# Patient Record
Sex: Female | Born: 1975 | Race: Black or African American | Hispanic: No | Marital: Married | Smoking: Former smoker
Health system: Southern US, Community
[De-identification: ages and names within clinical notes are randomized; demographics above are authoritative.]

## PROBLEM LIST (undated history)

## (undated) DIAGNOSIS — K219 Gastro-esophageal reflux disease without esophagitis: Secondary | ICD-10-CM

## (undated) DIAGNOSIS — I10 Essential (primary) hypertension: Secondary | ICD-10-CM

---

## 2003-12-17 ENCOUNTER — Other Ambulatory Visit: Admission: RE | Admit: 2003-12-17 | Discharge: 2003-12-17 | Payer: Self-pay | Admitting: Obstetrics and Gynecology

## 2004-04-15 ENCOUNTER — Other Ambulatory Visit: Admission: RE | Admit: 2004-04-15 | Discharge: 2004-04-15 | Payer: Self-pay | Admitting: Obstetrics and Gynecology

## 2005-08-30 ENCOUNTER — Other Ambulatory Visit: Admission: RE | Admit: 2005-08-30 | Discharge: 2005-08-30 | Payer: Self-pay | Admitting: Obstetrics and Gynecology

## 2006-02-28 ENCOUNTER — Inpatient Hospital Stay (HOSPITAL_COMMUNITY): Admission: AD | Admit: 2006-02-28 | Discharge: 2006-02-28 | Payer: Self-pay | Admitting: Obstetrics and Gynecology

## 2006-03-06 ENCOUNTER — Inpatient Hospital Stay (HOSPITAL_COMMUNITY): Admission: AD | Admit: 2006-03-06 | Discharge: 2006-03-08 | Payer: Self-pay | Admitting: *Deleted

## 2006-03-06 ENCOUNTER — Encounter (INDEPENDENT_AMBULATORY_CARE_PROVIDER_SITE_OTHER): Payer: Self-pay | Admitting: *Deleted

## 2009-08-09 ENCOUNTER — Encounter: Admission: RE | Admit: 2009-08-09 | Discharge: 2009-08-09 | Payer: Self-pay | Admitting: Otolaryngology

## 2009-08-24 ENCOUNTER — Encounter: Payer: Self-pay | Admitting: Otolaryngology

## 2009-08-26 ENCOUNTER — Ambulatory Visit (HOSPITAL_COMMUNITY): Admission: RE | Admit: 2009-08-26 | Discharge: 2009-08-26 | Payer: Self-pay | Admitting: Otolaryngology

## 2009-09-04 ENCOUNTER — Ambulatory Visit: Payer: Self-pay | Admitting: Oncology

## 2009-09-16 LAB — CBC & DIFF AND RETIC
Basophils Absolute: 0.1 10*3/uL (ref 0.0–0.1)
Eosinophils Absolute: 0.3 10*3/uL (ref 0.0–0.5)
Immature Retic Fract: 3.3 % (ref 0.00–10.70)
LYMPH%: 19 % (ref 14.0–49.7)
MCH: 26.2 pg (ref 25.1–34.0)
MCHC: 32.7 g/dL (ref 31.5–36.0)
MCV: 80.1 fL (ref 79.5–101.0)
MONO#: 0.7 10*3/uL (ref 0.1–0.9)
NEUT#: 7.3 10*3/uL — ABNORMAL HIGH (ref 1.5–6.5)
NEUT%: 70.8 % (ref 38.4–76.8)
Platelets: 238 10*3/uL (ref 145–400)

## 2009-09-16 LAB — MORPHOLOGY
PLT EST: ADEQUATE
RBC Comments: NORMAL

## 2009-09-16 LAB — CHCC SMEAR

## 2009-09-21 LAB — HYPERCOAGULABLE PANEL, COMPREHENSIVE RET.
AntiThromb III Func: 92 % (ref 76–126)
DRVVT 1:1 Mix: 40.3 secs (ref 36.1–47.0)
DRVVT: 51.2 secs — ABNORMAL HIGH (ref 34.7–40.5)
Homocysteine: 3.1 umol/L — ABNORMAL LOW (ref 4.0–15.4)
PTT Lupus Anticoagulant: 46.2 secs — ABNORMAL HIGH (ref 32.0–43.4)
PTTLA 4:1 Mix: 43.1 secs (ref 36.3–48.8)
Protein C Activity: 141 % — ABNORMAL HIGH (ref 75–133)
Protein C, Total: 104 % (ref 70–140)
Protein S Ag, Total: 118 % (ref 70–140)

## 2009-10-25 IMAGING — XA IR ANGIO/CAROTID/CERV BI
10 of 13 series · 12 of 24 positions shown · IV contrast (IODINE)
Comparison: MRI and MRV scan of the brain of 08/09/2009.

CLINICAL DATA: Headaches.  Right-sided pulsatile tinnitus with
hearing loss.

BILATERAL CAROTID ARTERIOGRAPHY AND BILATERAL VERTEBRAL ARTERY
ANGIOGRAMS

[Series 2: carotid · 1 of 25 slices shown (1 of 9)]
[im 1/25]
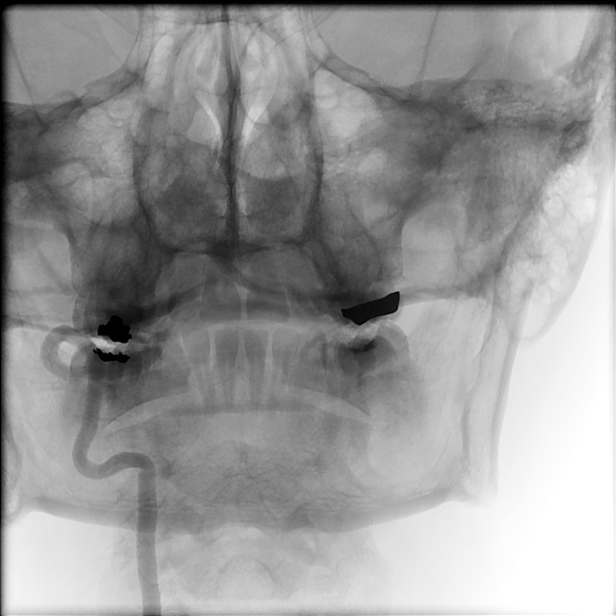

[Series 4: carotid · 1 of 8 slices shown (2 of 9)]
[im 1/8]
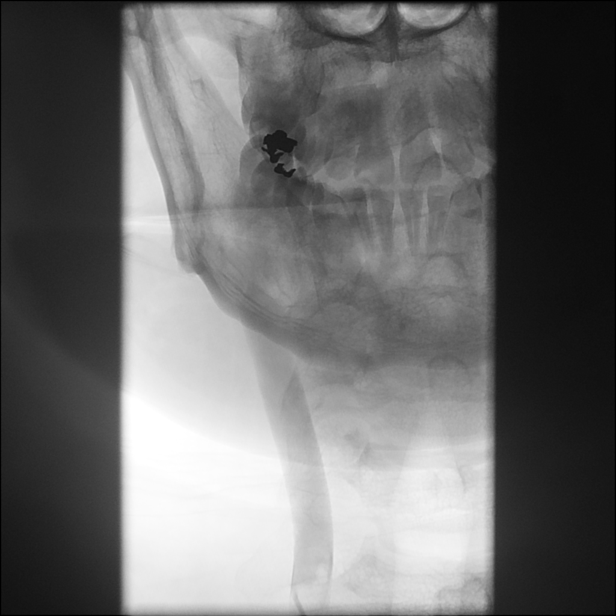

[Series 5: carotid · 2 of 38 slices shown (3 of 9)]
[im 13/38]
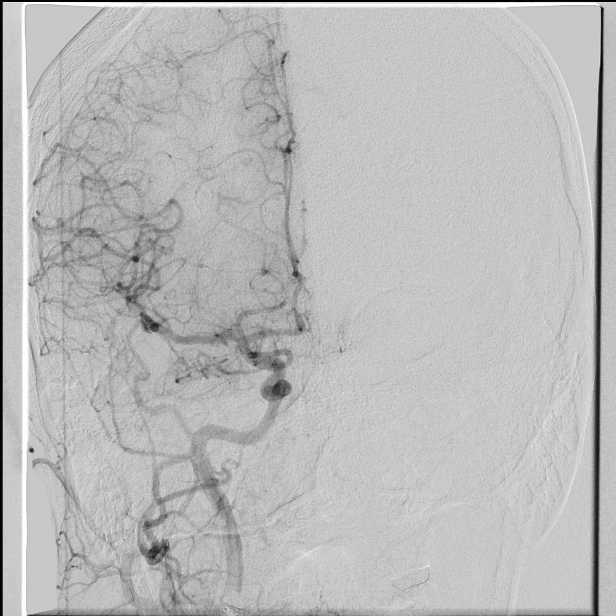
[im 38/38]
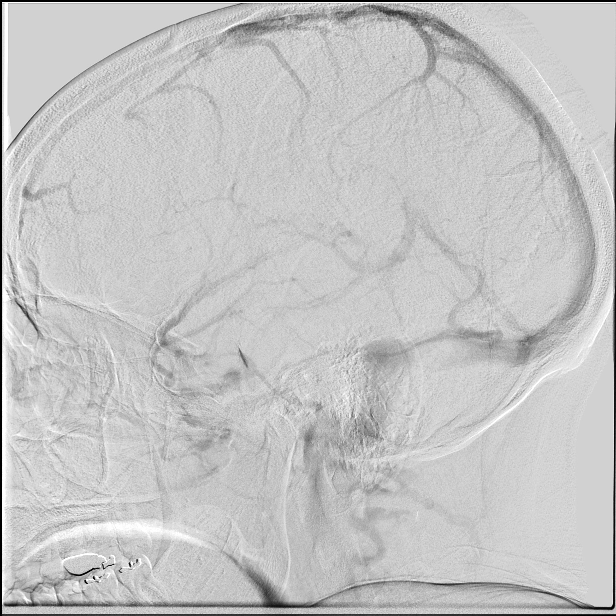

[Series 6: carotid · 1 of 26 slices shown (4 of 9)]
[im 26/26]
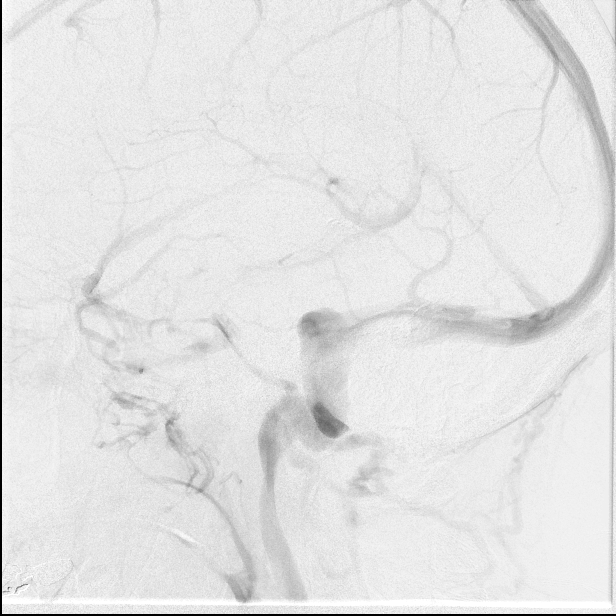

[Series 8: carotid · 2 of 30 slices shown (5 of 9)]
[im 1/30]
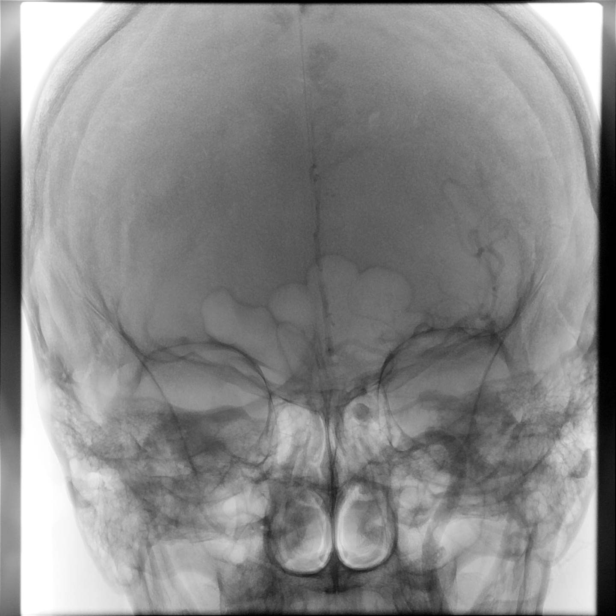
[im 30/30]
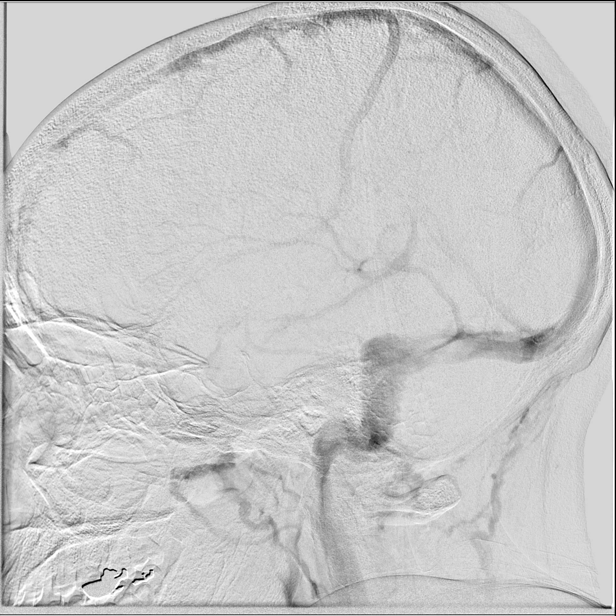

[Series 9: carotid · 1 of 29 slices shown (6 of 9)]
[im 15/29]
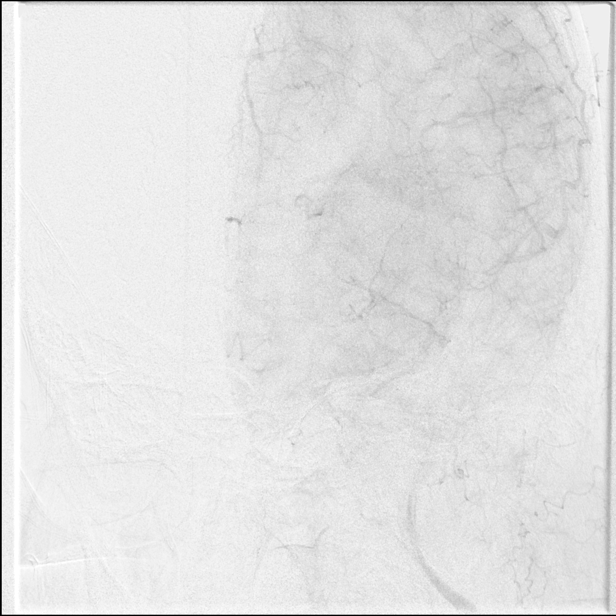

[Series 10: single · 1 of 2 slices shown]
[im 1/2]
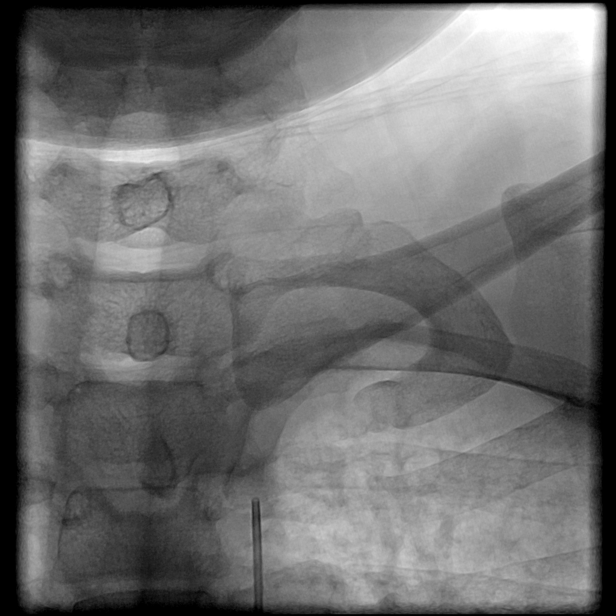

[Series 11: carotid · 1 of 20 slices shown (7 of 9)]
[im 20/20]
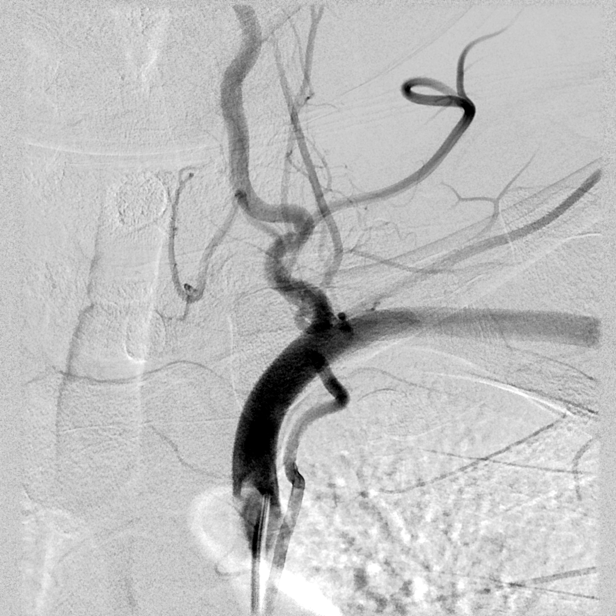

[Series 12: carotid · 1 of 22 slices shown (8 of 9)]
[im 22/22]
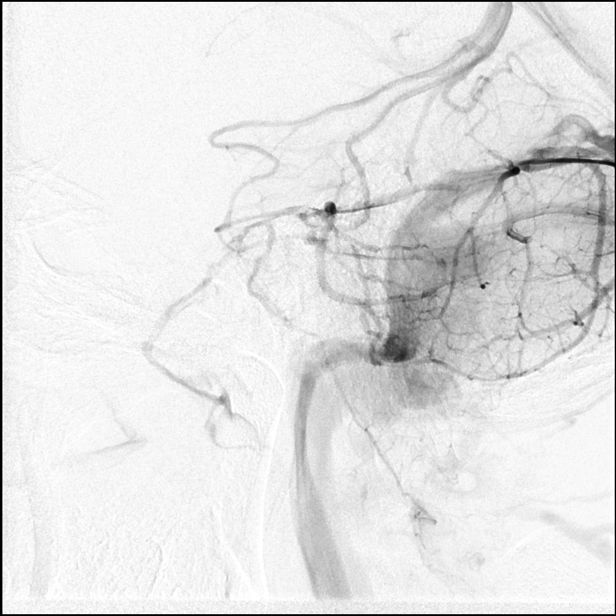

[Series 13: carotid · 1 of 24 slices shown (9 of 9)]
[im 24/24]
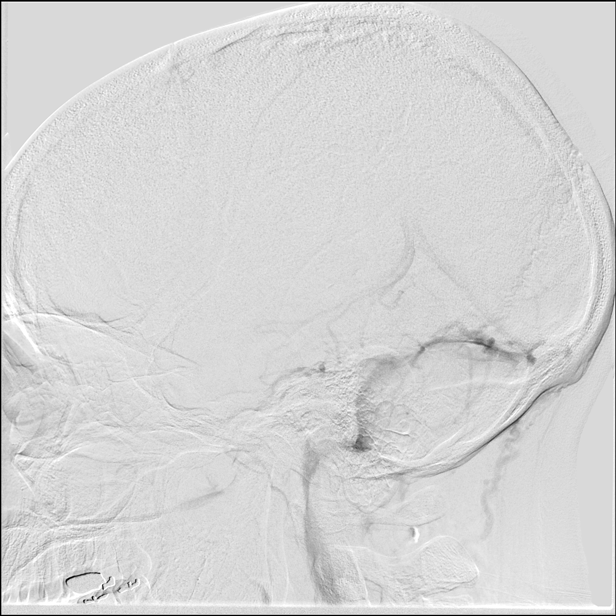

[12 of 24 positions shown; findings below may reference images not displayed]

Following a full explanation of the procedure along with the
potential associated complications, an informed witnessed consent
was obtained.

The right groin was prepped and draped in the usual sterile
fashion.  Thereafter using a modified Seldinger technique,
transfemoral access into the right common femoral artery was
obtained without difficulty.  Over a 0.035-inch guidewire, a 5-
French Pinnacle sheath was inserted.  Through this and also over a
0.035-inch guidewire, a 5-French JB-1 catheter was advanced to the
aortic arch region and selectively positioned in the right
vertebral artery, the right common carotid artery, the left common
carotid artery and the left vertebral artery.

There were no acute complications.  The patient tolerated the
procedure well.

Medications utilized: Versed 1 mg IV.  Fentanyl 25 mcg IV.

Contrast: Omnipaque 300 approximately 75 ml.
FINDINGS: The right vertebral artery origin is normal.  The vessel
is seen to be opacify normally to the cranial skull base.

Normal opacification of the right posterior inferior cerebellar
artery and the right vertebrobasilar junction is seen.

The basilar artery and the opacified portions of the posterior
cerebral arteries, the superior cerebellar arteries and the
anterior-inferior cerebellar arteries are normal into the capillary
and venous phases.  Unopacified blood is seen in the basilar artery
from the more dominant left vertebral artery.

The right common carotid arteriogram demonstrates the right
external carotid artery and its major branches to be normal.  The
right internal carotid artery at the bulb to the cranial skull base
is also normally opacified.  The petrous, the cavernous and the
supraclinoid segments are normal.  A dominant right posterior
communicating artery is seen to opacify the right posterior
cerebral and superior cerebellar artery distributions.

The right middle and the right anterior cerebral arteries are seen
to opacify normally into the capillary phase.

The venous phase demonstrates predominant opacification of the
right transverse and sigmoid sinuses.

The left common carotid arteriogram demonstrates the left external
carotid artery and its major branches to be normal.

The left internal carotid artery at the bulb to the cranial skull
base is also seen to opacify normally.

The petrous, the cavernous and the supraclinoid segments are
normal.  Again a dominant left posterior communicating artery is
seen opacifying the left posterior cerebral and transiently
superior cerebellar artery distributions.

The left middle and the left anterior cerebral arteries are seen to
opacify normally into the capillary phase.  The venous phase
demonstrates severely attenuated caliber of the left transverse
sinus to the level of the inflow from the vein of Rufus?.  The left
sigmoid sinus and the left internal jugular vein have a relatively
increased caliber.  Again predominant outflow of the venous blood
is via the right transverse sinus and right sigmoid sinus and into
the right internal jugular vein.

The left vertebral artery origin is normal.  The vessel is seen to
opacify normally to the cranial skull base.  There is normal
opacification of the left vertebrobasilar junction and the left
posterior inferior cerebellar artery.

The basilar artery, the opacified portions of the posterior
cerebral arteries, the superior cerebellar arteries and the
anterior-inferior cerebellar arteries is demonstrated to be normal
into the capillary phase with attenuated caliber of the left
transverse sinus, as described above.
IMPRESSION: 1.  Severely attenuated caliber of the left transverse sinus to the
level of the left vein of Rufus?.  This probably a normal variation
for the patient, with less likely early recanalization from a
recently occluded left transverse sinus, possibly related to a
thrombus.
2.  A followup MRV examination of the brain is suggested in 4 to 6
months if clinically indicated.

## 2011-04-01 LAB — DIFFERENTIAL
Basophils Absolute: 0 10*3/uL (ref 0.0–0.1)
Lymphocytes Relative: 6 % — ABNORMAL LOW (ref 12–46)
Monocytes Relative: 1 % — ABNORMAL LOW (ref 3–12)
Neutro Abs: 15.6 10*3/uL — ABNORMAL HIGH (ref 1.7–7.7)
Neutrophils Relative %: 93 % — ABNORMAL HIGH (ref 43–77)

## 2011-04-01 LAB — BASIC METABOLIC PANEL
BUN: 11 mg/dL (ref 6–23)
CO2: 20 mEq/L (ref 19–32)
Chloride: 109 mEq/L (ref 96–112)
Creatinine, Ser: 0.78 mg/dL (ref 0.4–1.2)
GFR calc non Af Amer: >60 mL/min (ref >60)
Potassium: 4.2 mEq/L (ref 3.5–5.1)
Sodium: 138 mEq/L (ref 135–145)

## 2011-04-01 LAB — PROTIME-INR: Prothrombin Time: 13.4 seconds (ref 11.6–15.2)

## 2011-04-01 LAB — CBC
Hemoglobin: 13.4 g/dL (ref 12.0–15.0)
MCHC: 33.1 g/dL (ref 30.0–36.0)
MCV: 82.4 fL (ref 78.0–100.0)
MCV: 83.5 fL (ref 78.0–100.0)
Platelets: 286 10*3/uL (ref 150–400)
RBC: 4.92 MIL/uL (ref 3.87–5.11)
RBC: 5.04 MIL/uL (ref 3.87–5.11)
RDW: 14.2 % (ref 11.5–15.5)
WBC: 16.8 10*3/uL — ABNORMAL HIGH (ref 4.0–10.5)

## 2011-04-01 LAB — HEPATIC FUNCTION PANEL
AST: 18 U/L (ref 0–37)
Total Protein: 7.9 g/dL (ref 6.0–8.3)

## 2011-05-10 NOTE — Consult Note (Signed)
[name], [name]              ACCOUNT NO.:  [id number]   MEDICAL RECORD NO.:  [id number]          PATIENT TYPE:  OUT   LOCATION:  XRAY                         FACILITY:  [facility]   PHYSICIAN:  [name], M.D.DATE OF BIRTH:  1976-10-16   DATE OF CONSULTATION:  08/24/2009  DATE OF DISCHARGE:  08/24/2009                                 CONSULTATION   CHIEF COMPLAINT:  Hearing loss in the right ear x6-7 months.   BRIEF HISTORY:  This is a very pleasant 35 year old female referred to  Dr. Corliss Contreras through the courtesy of Dr. Dorma Contreras after the patient was  evaluated for decreased hearing in her right ear and symptoms consistent  with pulsatile tinnitus.  The patient had an MRI/MRV performed on August 09, 2009.  This revealed a diminutive left transverse sinus smaller than  usually identified with a thin but continuous channel from the torcula  to the sigmoid sinus.  It was felt that this might possibly represent a  chronic occlusion with recannulization.  There was no evidence of clot  within the sinus.  The distal right transverse sinus bifurcated prior to  its junction with the sigmoid a common area for developmental  anomalies/arachnoid granulations.  Again, no findings were conclusive  for venous sinus thrombosis.  Based on this study, the patient has been  referred for a cerebral angiogram for further evaluation.  She presents  today to discuss the angiogram with Dr. Corliss Contreras.   PAST MEDICAL HISTORY:  Significant for:  1. Hypertension.  2. The patient has had daily headaches for several years.  She states      they are not severe.  3. She has had some childhood ear infections.  4. She does have a history of tobacco use.  5. She does use some sort of injectable birth control medication.  6. She has had some weight gain.  7. She denies any visual symptoms.  8. She notes that the sound in her right ear will disappear when      turning her head to the right and when  pressing on her right      internal jugular vein.   SURGICAL HISTORY:  Significant for:  1. A C-section in March 2007.  2. She has also had some wisdom teeth removed.  3. She has had a keloid surgery performed.   ALLERGIES:  The patient has no known drug allergies.  She does report  that SHRIMP causes her throat to itch.  She typically does not eat  seafood.   CURRENT MEDICATIONS:  1. Lasix 20 mg daily which was started by Dr. Dorma Contreras.  The patient      states her headaches have improved somewhat since starting the      Lasix.  2. She is also on Wellbutrin 150 mg daily.  She states she has to      tried to help her quit smoking, although she has not had any real      success so far.  3. She is on an injectable birth control method.   SOCIAL HISTORY:  The patient is married.  She has one 53-year-old  daughter.  The patient lives in [place] with her husband.  She smokes  5-8 cigarettes per day.  She does not use alcohol.  She works as a  Advertising copywriter at [employer].  She has a very sedentary-  type job.   FAMILY HISTORY:  Her mother is alive at age 7, she has a history of  hyperlipidemia.  Her father is alive at age 55, he has hypertension and  prostate cancer.   IMPRESSION AND PLAN:  The patient presents today to discuss a cerebral  angiogram, recommended by Dr. Dorma Contreras after the patient had an abnormal  MRI/MRV on August 09, 2009.  Please Contreras the description as noted above.  The cerebral angiogram was described in detail along with the risks and  benefits including bleeding, infection, nephropathy, allergy to the  contrast dye, and small risk of stroke.  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   Dr. Corliss Contreras shared the images from the MRI/MRV with the patient.  He  pointed out the areas of abnormality and discussed possible diagnoses,   although he told the patient that he could not be certain as to the  cause until we actually had the results from the angiogram.  He did  recommend that the patient try to quit smoking.  He also recommended  that she consider going off her current birth control method if it would  involve estrogen.  All of the patient's questions were answered.  Greater than 30 minutes was spent on this evaluation.  The patient has  been scheduled for the angiogram this Wednesday, August 26, 2009.      [name], P.A.    ______________________________  [name]. [name], M.D.    DR/MEDQ  D:  08/24/2009  T:  08/25/2009  Job:  [id number]   cc:   [name], M.D.  [facility]

## 2011-05-13 NOTE — Discharge Summary (Signed)
[name], [name]             ACCOUNT NO.:  [id number]   MEDICAL RECORD NO.:  [id number]          PATIENT TYPE:  INP   LOCATION:  9103                          [facility]   PHYSICIAN:  Charles A. Delcambre, MDDATE OF BIRTH:  October 09, 1976   DATE OF ADMISSION:  03/06/2006  DATE OF DISCHARGE:                                 DISCHARGE SUMMARY   PRIMARY DISCHARGE DIAGNOSES:  1.  Intrauterine pregnancy 38 weeks 6 days.  2.  Nonreassuring fetal heart rate.  3.  Arrest of dilation at 4-5 cm.  4.  Meconium.   PROCEDURE:  Primary low transverse cesarean section.   DISPOSITION:  Patient discharged home to follow up in the office in 48 hours  to discontinue staples.  She is given convalescent instructions.  Notify of  increased bleeding or pain, temperature over 100 degrees, or incisional  drainage, headaches, scotomata, right upper quadrant pain.  She is  instructed no driving for two weeks, no lifting greater than 25 pounds for  one month.  She is given prescription for Percocet one to two p.o. q.4h.  p.r.n. #40 and Micronor one p.o. daily one pack refilled x1 year.  She is to  use over-the-counter iron, her preference, one tablet a day.   LABORATORIES:  Postoperative hematocrit 28.4, hemoglobin 9.6.   OPERATIVE FINDING:  Vigorous female.  Apgars 8 and 9.  3005 g.   H&P is written in the chart.   Patient was admitted in labor.  Underwent spontaneous labor, then Pitocin  augmentation.  Had nonreassuring fetal heart rate develop at 4-5 cm dilation  and for this reason recommendation was for cesarean section.  Cesarean  section was carried out without complication.  Postoperatively patient had  routine postoperative course without febrile morbidity.  She did have some  mild elevated blood pressures 121/98, 134/90.  By the time we discharged  pressure had normalized and this had lowered to this degree and magnesium  prophylaxis was not carried out.  PIH laboratories were negative  and  proteinuria was negative.  She had good pain relief postoperatively with  epidural and Duramorph and was switched to p.o. pain medications  postoperative day one with good control of pain.  She voided without  difficulty with catheter removed postoperative day one.  She was given  general diet with flatus returned postoperative day #1.  She continued to do  very well postoperative day #2 and was discharged home with follow-up as  noted above per her adamant request to go home on postoperative day #2 with  instructions as noted above.      Charles A. Sydnee Cabal, MD  Electronically Signed    CAD/MEDQ  D:  03/08/2006  T:  03/08/2006  Job:  [id number]

## 2011-05-13 NOTE — Op Note (Signed)
[name], [name]             ACCOUNT NO.:  [id number]   MEDICAL RECORD NO.:  [id number]          PATIENT TYPE:  INP   LOCATION:  9103                          [facility]   PHYSICIAN:  Charles A. Delcambre, MDDATE OF BIRTH:  1976/08/15   DATE OF PROCEDURE:  03/06/2006  DATE OF DISCHARGE:                                 OPERATIVE REPORT   PREOPERATIVE DIAGNOSES:  1.  Intrauterine pregnancy at 38 weeks and 6 days.  2.  Non-reassuring fetal heart rate tracing.  3.  Arrest of dilation at 5 cm.  4.  Meconium.   POSTOPERATIVE DIAGNOSES:  1.  Intrauterine pregnancy at 38 weeks and 6 days.  2.  Non-reassuring fetal heart rate tracing.  3.  Arrest of dilation at 5 cm.  4.  Meconium.   OPERATION/PROCEDURE:  Primary low transverse cesarean section.   SURGEON:  Charles A. Sydnee Cabal, M.D.   ASSISTANT:  None.   ANESTHESIA:  Epidural.   SPECIMENS:  Cord arterial PH 7.28 as I recollect. Not on the chart at this  time.  Placenta to pathology.   OPERATIVE FINDINGS:  Female baby.  Vigorous.  Apgars 8 and 9.  Weight not on  the chart as of this point.   ESTIMATED BLOOD LOSS:  500 mL.   COUNTS:  Instrument, sponge and needle counts correct x2.   DESCRIPTION OF PROCEDURE:  The patient was taken to the operating room,  placed in the supine position and anesthesia was dosed and found to be  adequate.  Sterile prep and drape was undertaken.  A Pfannenstiel incision  was made with the knife and carried down to the fascia.  The fascia was  incised with the knife and Mayo scissors.  Rectus sheath was released  superiorly and inferiorly.  Rectus muscle was bluntly dissected in the  midline.  Peritoneum was entered with the finger.  Traction was used to  extend this incision.  Vesicouterine peritoneum was incised with the  Metzenbaum scissors.  Blunt dissection was used to develop the bladder flap.  Bladder blade was placed.  The lower uterine segment transverse incision was  made to  the amniotomy without damage to the infant.  Traction was used to  extend the incision.  Hand was inserted.  Occiput was lifted to the incision  site.  Fundal pressure was placed by the operator assistant.  Infant was  delivered to the head and shoulders without difficulty.  DeLee suction was  carried out on the patient's abdomen before the baby cried.  Meconium  appeared to be light.  At this time after delivery of the remainder of the  baby, baby vigorously cried, was bulb-suctioned further and shown to the  patient and handed off to the neonatologist, Dr. Eric Form in attendance.  Cord  gas was sent. As noted cord blood was taken.  Placenta was manually  expressed and sent to pathology.  Uterus was externalized for repair.  Internal surfaces were wiped with a moistened lap.  A #1 chromic running  locking first layer and then running imbricating non-locking second layer  was placed for a two-layer closure  with good hemostasis resulting.  Uterus  was reinternalized.  The bladder flap was with good hemostasis.  Incision  had good hemostasis as well.  Pericolic gutters were cleansed of clotted  blood material and irrigated.  Bladder flap was irrigated.  All incision  sites, uterine incision site and bladder flap were noted to be of excellent  hemostasis.  Subfascial hemostasis was excellent.  Fascia was then closed  with #1 Vicryl running non-locking suture.  Subcutaneous hemostasis was  excellent and after minor electrocautery, irrigation was carried out to this  layer of sterile skin.  Clips were used to close the skin.  The patient did  note that she had history of keloiding and we did discuss the possibility of  keloid formation with the abdominal scar but it did nothing specific at this  point.  She was in agreement and did consent to this.  Incision was made as  small as possible for the surgery.  The patient was taken to recovery with  the physician in attendance having tolerated the  procedure well.      Charles A. Sydnee Cabal, MD  Electronically Signed     CAD/MEDQ  D:  03/06/2006  T:  03/07/2006  Job:  [id number]

## 2014-09-19 ENCOUNTER — Other Ambulatory Visit: Payer: Self-pay | Admitting: Obstetrics and Gynecology

## 2014-09-22 LAB — CYTOLOGY - PAP

## 2015-04-01 LAB — OB RESULTS CONSOLE HIV ANTIBODY (ROUTINE TESTING): HIV: NONREACTIVE

## 2015-04-01 LAB — OB RESULTS CONSOLE RUBELLA ANTIBODY, IGM: RUBELLA: IMMUNE

## 2015-04-01 LAB — OB RESULTS CONSOLE ABO/RH: RH TYPE: POSITIVE

## 2015-10-05 NOTE — Anesthesia Preprocedure Evaluation (Addendum)
Anesthesia Evaluation  Patient identified by MRN, date of birth, ID band Patient awake    Reviewed: Allergy & Precautions, NPO status , Patient's Chart, lab work & pertinent test results, reviewed documented beta blocker date and time   Airway Mallampati: II   Neck ROM: Full    Dental  (+) Teeth Intact, Dental Advisory Given   Pulmonary Current Smoker (1/2 ppd), former smoker,    breath sounds clear to auscultation       Cardiovascular hypertension, Pt. on medications  Rhythm:Regular     Neuro/Psych  Headaches,    GI/Hepatic   Endo/Other    Renal/GU      Musculoskeletal   Abdominal (+) + obese,   Peds  Hematology   Anesthesia Other Findings   Reproductive/Obstetrics For RCS, BTL                            Anesthesia Physical Anesthesia Plan  ASA: II  Anesthesia Plan: Spinal   Post-op Pain Management:    Induction:   Airway Management Planned: Natural Airway  Additional Equipment:   Intra-op Plan:   Post-operative Plan:   Informed Consent: I have reviewed the patients History and Physical, chart, labs and discussed the procedure including the risks, benefits and alternatives for the proposed anesthesia with the patient or authorized representative who has indicated his/her understanding and acceptance.     Plan Discussed with:   Anesthesia Plan Comments:         Anesthesia Quick Evaluation

## 2015-10-08 ENCOUNTER — Encounter (HOSPITAL_COMMUNITY)
Admission: RE | Admit: 2015-10-08 | Discharge: 2015-10-08 | Disposition: A | Discharge status: Home / Self Care | Payer: PRIVATE HEALTH INSURANCE | Source: Ambulatory Visit | Attending: Obstetrics and Gynecology | Admitting: Obstetrics and Gynecology

## 2015-10-08 ENCOUNTER — Encounter (HOSPITAL_COMMUNITY): Payer: Self-pay

## 2015-10-08 HISTORY — DX: Essential (primary) hypertension: I10

## 2015-10-08 HISTORY — DX: Gastro-esophageal reflux disease without esophagitis: K21.9

## 2015-10-08 LAB — CBC
HCT: 30.2 % — ABNORMAL LOW (ref 36.0–46.0)
HEMOGLOBIN: 9.8 g/dL — AB (ref 12.0–15.0)
MCH: 26.6 pg (ref 26.0–34.0)
MCHC: 32.5 g/dL (ref 30.0–36.0)
MCV: 82.1 fL (ref 78.0–100.0)
Platelets: 191 10*3/uL (ref 150–400)
RBC: 3.68 MIL/uL — ABNORMAL LOW (ref 3.87–5.11)
RDW: 15 % (ref 11.5–15.5)
WBC: 13.9 10*3/uL — AB (ref 4.0–10.5)

## 2015-10-08 LAB — TYPE AND SCREEN
ABO/RH(D): A POS
Antibody Screen: NEGATIVE

## 2015-10-08 LAB — COMPREHENSIVE METABOLIC PANEL
ALBUMIN: 2.8 g/dL — AB (ref 3.5–5.0)
ALT: 28 U/L (ref 14–54)
ANION GAP: 9 (ref 5–15)
AST: 24 U/L (ref 15–41)
Alkaline Phosphatase: 120 U/L (ref 38–126)
BUN: 6 mg/dL (ref 6–20)
CO2: 21 mmol/L — AB (ref 22–32)
Calcium: 8.7 mg/dL — ABNORMAL LOW (ref 8.9–10.3)
Chloride: 109 mmol/L (ref 101–111)
Creatinine, Ser: 0.53 mg/dL (ref 0.44–1.00)
GFR calc Af Amer: >60 mL/min (ref >60)
GFR calc non Af Amer: >60 mL/min (ref >60)
GLUCOSE: 103 mg/dL — AB (ref 65–99)
POTASSIUM: 3.8 mmol/L (ref 3.5–5.1)
SODIUM: 139 mmol/L (ref 135–145)
Total Bilirubin: 0.5 mg/dL (ref 0.3–1.2)
Total Protein: 6.2 g/dL — ABNORMAL LOW (ref 6.5–8.1)

## 2015-10-08 LAB — RPR: RPR: NONREACTIVE

## 2015-10-08 LAB — ABO/RH: ABO/RH(D): A POS

## 2015-10-08 NOTE — H&P (Signed)
[name] is a 39 y.o. female G3P1011 at 37wk with CHTN, PIH, BP not well-controlled with labetalol 200 bid, earlier in preg controlled with labetalol 100 bid.  24hr urine negative for PreEclampsia, normal labs.  Pregnancy complicated by HTN.  Pregnancy also complicated by h/o LTCS and AMA.  Cervix not favorable. Pt desres BTL - Partial salpingectomy, d/w pt r/b/a  Maternal Medical History:  Contractions: Frequency: irregular.    Fetal activity: Perceived fetal activity is normal.    Prenatal complications: PIH.   Prenatal Complications - Diabetes: none.    OB History    Gravida Para Term Preterm AB TAB SAB Ectopic Multiple Living   3 1 1  0 1 0 0 0 0 1    G1 02/2006 LTCS 6#10 female G2 SAB G3 present  No abnl pap No STD  Past Medical History  Diagnosis Date  . Hypertension   . GERD (gastroesophageal reflux disease)     pregnacy related   Past Surgical History  Procedure Laterality Date  . Cesarean section      x1   Family History: HTN, DM, CAD Social History:  reports that she quit smoking about 21 months ago. She does not have any smokeless tobacco history on file. She reports that she does not drink alcohol or use illicit drugs. married, works in [employer] Meds Labetalol 200 bid, PNV All NKDA   Prenatal Transfer Tool  Maternal Diabetes: No Genetic Screening: Declined Maternal Ultrasounds/Referrals: Normal Fetal Ultrasounds or other Referrals:  None Maternal Substance Abuse:  No Significant Maternal Medications:  Meds include: Other: Labetalol 200mg  bid Significant Maternal Lab Results:  Lab values include: Group B Strep positive Other Comments:  failed glucola, passed 3hr GTT; PIH neg 24 hr urine  Review of Systems  Constitutional: Negative.   Eyes: Negative.   Respiratory: Negative.   Cardiovascular: Negative.   Gastrointestinal: Positive for abdominal pain.  Genitourinary: Negative.   Musculoskeletal: Positive for back pain.  Skin: Negative.    Neurological: Positive for headaches.  Psychiatric/Behavioral: Negative.       Last menstrual period 12/26/2013. Maternal Exam:  Uterine Assessment: Contraction frequency is irregular.   Abdomen: Patient reports no abdominal tenderness. Surgical scars: low transverse.   Fundal height is appropriate for gestation.   Estimated fetal weight is 6.5-7.5#.   Fetal presentation: vertex  Introitus: Normal vulva. Normal vagina.    Physical Exam  Constitutional: She is oriented to person, place, and time. She appears well-developed and well-nourished.  HENT:  Head: Normocephalic and atraumatic.  Cardiovascular: Normal rate and regular rhythm.   Respiratory: Effort normal and breath sounds normal. No respiratory distress. She has no wheezes.  GI: Soft. Bowel sounds are normal. She exhibits no distension. There is no tenderness.  Musculoskeletal: Normal range of motion.  Neurological: She is alert and oriented to person, place, and time.  Skin: Skin is warm and dry.  Psychiatric: She has a normal mood and affect. Her behavior is normal.    Prenatal labs: ABO, Rh: --/--/A POS, A POS (10/13 0825) Antibody: NEG (10/13 0825) Rubella:  immune RPR: Non Reactive (10/13 0825)  HBsAg:   neg HIV:   neg GBS:   positive  Hgb 12.5/Plt 279/Ur Cx neg/GC neg/Chl neg/ glucola 149 - 3hr normal  24hr urine 179mg  protein, nl LFTs, nl Cr Dated by US cwd LMP Nl anat, fundal plac, femlae - EDC 10/30/15  Assessment/Plan: 39yo G3P1011 at 37wk for rLTCS/BTL D/w pt r/b/a - will proceed Pt with PIH, worsening  BP Ancef for prophylaxis  Bovard-Stuckert, Chaim Gatley 10/08/2015, 9:56 PM

## 2015-10-08 NOTE — Patient Instructions (Addendum)
   Your procedure is scheduled on: OCT 14 AT 730AM  [facility] at: 6AM  Pick up the phone at the desk and dial [phone] and inform us of your arrival.  Please call this number if you have any problems the morning of surgery: [phone]  DO NOT EAT OR DRINK AFTER MIDNIGHT TONIGHT OCT 13  Take these medicines the morning of surgery with a SIP OF WATER: TAKE LABATOLOL DAY OF SURGERY  Do not wear jewelry, make-up, or FINGER nail polish No metal in your hair or on your body. Do not wear lotions, powders, perfumes.  You may wear deodorant.  Do not bring valuables to the hospital. Contacts, dentures or bridgework may not be worn into surgery.  Leave suitcase in the car. After Surgery it may be brought to your room. For patients being admitted to the hospital, checkout time is 11:00am the day of discharge.

## 2015-10-09 ENCOUNTER — Inpatient Hospital Stay (HOSPITAL_COMMUNITY): Payer: PRIVATE HEALTH INSURANCE | Admitting: Anesthesiology

## 2015-10-09 ENCOUNTER — Inpatient Hospital Stay (HOSPITAL_COMMUNITY)
Admission: RE | Admit: 2015-10-09 | Discharge: 2015-10-11 | DRG: 765 | Disposition: A | Discharge status: Home / Self Care | Payer: PRIVATE HEALTH INSURANCE | Source: Ambulatory Visit | Attending: Obstetrics and Gynecology | Admitting: Obstetrics and Gynecology

## 2015-10-09 ENCOUNTER — Encounter (HOSPITAL_COMMUNITY)
Admission: RE | Disposition: A | Discharge status: Home / Self Care | Payer: Self-pay | Source: Ambulatory Visit | Attending: Obstetrics and Gynecology

## 2015-10-09 ENCOUNTER — Encounter (HOSPITAL_COMMUNITY): Payer: Self-pay | Admitting: Anesthesiology

## 2015-10-09 DIAGNOSIS — O34211 Maternal care for low transverse scar from previous cesarean delivery: Secondary | ICD-10-CM | POA: Diagnosis present

## 2015-10-09 DIAGNOSIS — Z8249 Family history of ischemic heart disease and other diseases of the circulatory system: Secondary | ICD-10-CM

## 2015-10-09 DIAGNOSIS — O9962 Diseases of the digestive system complicating childbirth: Secondary | ICD-10-CM | POA: Diagnosis present

## 2015-10-09 DIAGNOSIS — Z3A37 37 weeks gestation of pregnancy: Secondary | ICD-10-CM | POA: Diagnosis not present

## 2015-10-09 DIAGNOSIS — Z87891 Personal history of nicotine dependence: Secondary | ICD-10-CM

## 2015-10-09 DIAGNOSIS — O99824 Streptococcus B carrier state complicating childbirth: Secondary | ICD-10-CM | POA: Diagnosis present

## 2015-10-09 DIAGNOSIS — O1092 Unspecified pre-existing hypertension complicating childbirth: Secondary | ICD-10-CM | POA: Diagnosis present

## 2015-10-09 DIAGNOSIS — O10913 Unspecified pre-existing hypertension complicating pregnancy, third trimester: Secondary | ICD-10-CM | POA: Diagnosis present

## 2015-10-09 DIAGNOSIS — Z302 Encounter for sterilization: Secondary | ICD-10-CM | POA: Diagnosis not present

## 2015-10-09 DIAGNOSIS — Z833 Family history of diabetes mellitus: Secondary | ICD-10-CM | POA: Diagnosis not present

## 2015-10-09 DIAGNOSIS — Z98891 History of uterine scar from previous surgery: Secondary | ICD-10-CM

## 2015-10-09 DIAGNOSIS — K219 Gastro-esophageal reflux disease without esophagitis: Secondary | ICD-10-CM | POA: Diagnosis present

## 2015-10-09 SURGERY — Surgical Case
Anesthesia: Spinal | Laterality: Bilateral

## 2015-10-09 MED ORDER — SIMETHICONE 80 MG PO CHEW
80.0000 mg | CHEWABLE_TABLET | ORAL | Status: DC | PRN
  Administered 2015-10-11: 80 mg via ORAL

## 2015-10-09 MED ORDER — SCOPOLAMINE 1 MG/3DAYS TD PT72
MEDICATED_PATCH | TRANSDERMAL | Status: AC
  Administered 2015-10-09: 1.5 mg via TRANSDERMAL
  Filled 2015-10-09: qty 1

## 2015-10-09 MED ORDER — LABETALOL HCL 200 MG PO TABS
200.0000 mg | ORAL_TABLET | Freq: Two times a day (BID) | ORAL | Status: DC
Start: 2015-10-09 — End: 2015-10-11
  Administered 2015-10-09 – 2015-10-11 (×4): 200 mg via ORAL
  Filled 2015-10-09 (×4): qty 1

## 2015-10-09 MED ORDER — SENNOSIDES-DOCUSATE SODIUM 8.6-50 MG PO TABS
2.0000 | ORAL_TABLET | ORAL | Status: DC
  Administered 2015-10-10 (×2): 2 via ORAL
  Filled 2015-10-09 (×2): qty 2

## 2015-10-09 MED ORDER — MORPHINE SULFATE (PF) 0.5 MG/ML IJ SOLN
INTRAMUSCULAR | Status: DC | PRN
  Administered 2015-10-09: .2 mg via EPIDURAL

## 2015-10-09 MED ORDER — LACTATED RINGERS IV SOLN
Freq: Once | INTRAVENOUS | Status: AC
Start: 2015-10-09 — End: 2015-10-09
  Administered 2015-10-09: 07:00:00 via INTRAVENOUS

## 2015-10-09 MED ORDER — OXYCODONE-ACETAMINOPHEN 5-325 MG PO TABS: 2.0000 | ORAL_TABLET | ORAL | Status: DC | PRN

## 2015-10-09 MED ORDER — LACTATED RINGERS IV SOLN
INTRAVENOUS | Status: DC | PRN
  Administered 2015-10-09: 08:00:00 via INTRAVENOUS

## 2015-10-09 MED ORDER — ONDANSETRON HCL 4 MG/2ML IJ SOLN
INTRAMUSCULAR | Status: DC | PRN
  Administered 2015-10-09: 4 mg via INTRAVENOUS

## 2015-10-09 MED ORDER — CEFAZOLIN SODIUM-DEXTROSE 2-3 GM-% IV SOLR
2.0000 g | INTRAVENOUS | Status: AC
  Administered 2015-10-09: 2 g via INTRAVENOUS

## 2015-10-09 MED ORDER — ONDANSETRON HCL 4 MG/2ML IJ SOLN
INTRAMUSCULAR | Status: AC
  Filled 2015-10-09: qty 2

## 2015-10-09 MED ORDER — CEFAZOLIN SODIUM-DEXTROSE 2-3 GM-% IV SOLR
INTRAVENOUS | Status: AC
  Filled 2015-10-09: qty 50

## 2015-10-09 MED ORDER — ZOLPIDEM TARTRATE 5 MG PO TABS: 5.0000 mg | ORAL_TABLET | Freq: Every evening | ORAL | Status: DC | PRN

## 2015-10-09 MED ORDER — KETOROLAC TROMETHAMINE 30 MG/ML IJ SOLN
30.0000 mg | Freq: Once | INTRAMUSCULAR | Status: AC
  Administered 2015-10-09: 30 mg via INTRAMUSCULAR

## 2015-10-09 MED ORDER — KETOROLAC TROMETHAMINE 30 MG/ML IJ SOLN
INTRAMUSCULAR | Status: AC
  Filled 2015-10-09: qty 1

## 2015-10-09 MED ORDER — TETANUS-DIPHTH-ACELL PERTUSSIS 5-2.5-18.5 LF-MCG/0.5 IM SUSP
0.5000 mL | Freq: Once | INTRAMUSCULAR | Status: AC
  Administered 2015-10-10: 0.5 mL via INTRAMUSCULAR
  Filled 2015-10-09: qty 0.5

## 2015-10-09 MED ORDER — OXYCODONE-ACETAMINOPHEN 5-325 MG PO TABS
1.0000 | ORAL_TABLET | ORAL | Status: DC | PRN
  Administered 2015-10-10: 1 via ORAL
  Filled 2015-10-09: qty 1

## 2015-10-09 MED ORDER — WITCH HAZEL-GLYCERIN EX PADS: 1.0000 "application " | MEDICATED_PAD | CUTANEOUS | Status: DC | PRN

## 2015-10-09 MED ORDER — LACTATED RINGERS IV SOLN
INTRAVENOUS | Status: DC
  Administered 2015-10-10: 01:00:00 via INTRAVENOUS

## 2015-10-09 MED ORDER — DIPHENHYDRAMINE HCL 25 MG PO CAPS
25.0000 mg | ORAL_CAPSULE | Freq: Four times a day (QID) | ORAL | Status: DC | PRN
Start: 2015-10-09 — End: 2015-10-11

## 2015-10-09 MED ORDER — SCOPOLAMINE 1 MG/3DAYS TD PT72
1.0000 | MEDICATED_PATCH | Freq: Once | TRANSDERMAL | Status: DC
  Administered 2015-10-09: 1.5 mg via TRANSDERMAL

## 2015-10-09 MED ORDER — OXYTOCIN 10 UNIT/ML IJ SOLN
INTRAMUSCULAR | Status: AC
  Filled 2015-10-09: qty 4

## 2015-10-09 MED ORDER — FENTANYL CITRATE (PF) 100 MCG/2ML IJ SOLN: 25.0000 ug | INTRAMUSCULAR | Status: DC | PRN

## 2015-10-09 MED ORDER — SIMETHICONE 80 MG PO CHEW
80.0000 mg | CHEWABLE_TABLET | Freq: Three times a day (TID) | ORAL | Status: DC
  Administered 2015-10-09 – 2015-10-10 (×4): 80 mg via ORAL
  Filled 2015-10-09 (×5): qty 1

## 2015-10-09 MED ORDER — MORPHINE SULFATE (PF) 0.5 MG/ML IJ SOLN
INTRAMUSCULAR | Status: AC
  Filled 2015-10-09: qty 100

## 2015-10-09 MED ORDER — IBUPROFEN 800 MG PO TABS
800.0000 mg | ORAL_TABLET | Freq: Three times a day (TID) | ORAL | Status: DC
  Administered 2015-10-09 – 2015-10-11 (×5): 800 mg via ORAL
  Filled 2015-10-09 (×5): qty 1

## 2015-10-09 MED ORDER — SIMETHICONE 80 MG PO CHEW
80.0000 mg | CHEWABLE_TABLET | ORAL | Status: DC
  Administered 2015-10-10 (×2): 80 mg via ORAL
  Filled 2015-10-09 (×2): qty 1

## 2015-10-09 MED ORDER — PHENYLEPHRINE 8 MG IN D5W 100 ML (0.08MG/ML) PREMIX OPTIME
INJECTION | INTRAVENOUS | Status: AC
  Filled 2015-10-09: qty 100

## 2015-10-09 MED ORDER — BUPIVACAINE IN DEXTROSE 0.75-8.25 % IT SOLN
INTRATHECAL | Status: DC | PRN
  Administered 2015-10-09: 1.6 mL via INTRATHECAL

## 2015-10-09 MED ORDER — OXYTOCIN 10 UNIT/ML IJ SOLN
40.0000 [IU] | INTRAMUSCULAR | Status: DC | PRN
Start: 2015-10-09 — End: 2015-10-09
  Administered 2015-10-09: 40 [IU] via INTRAVENOUS

## 2015-10-09 MED ORDER — PHENYLEPHRINE 8 MG IN D5W 100 ML (0.08MG/ML) PREMIX OPTIME
INJECTION | INTRAVENOUS | Status: DC | PRN
  Administered 2015-10-09: 30 ug/min via INTRAVENOUS

## 2015-10-09 MED ORDER — PROMETHAZINE HCL 25 MG/ML IJ SOLN
6.2500 mg | INTRAMUSCULAR | Status: DC | PRN
Start: 2015-10-09 — End: 2015-10-09

## 2015-10-09 MED ORDER — DIBUCAINE 1 % RE OINT: 1.0000 "application " | TOPICAL_OINTMENT | RECTAL | Status: DC | PRN

## 2015-10-09 MED ORDER — OXYTOCIN 40 UNITS IN LACTATED RINGERS INFUSION - SIMPLE MED: 62.5000 mL/h | INTRAVENOUS | Status: AC

## 2015-10-09 MED ORDER — LANOLIN HYDROUS EX OINT: 1.0000 "application " | TOPICAL_OINTMENT | CUTANEOUS | Status: DC | PRN

## 2015-10-09 MED ORDER — PRENATAL MULTIVITAMIN CH
1.0000 | ORAL_TABLET | Freq: Every day | ORAL | Status: DC
  Administered 2015-10-10: 1 via ORAL
  Filled 2015-10-09: qty 1

## 2015-10-09 MED ORDER — FENTANYL CITRATE (PF) 100 MCG/2ML IJ SOLN
INTRAMUSCULAR | Status: AC
  Filled 2015-10-09: qty 4

## 2015-10-09 MED ORDER — MENTHOL 3 MG MT LOZG: 1.0000 | LOZENGE | OROMUCOSAL | Status: DC | PRN

## 2015-10-09 MED ORDER — FENTANYL CITRATE (PF) 100 MCG/2ML IJ SOLN
INTRAMUSCULAR | Status: DC | PRN
  Administered 2015-10-09: 20 ug via INTRAVENOUS

## 2015-10-09 MED ORDER — ACETAMINOPHEN 325 MG PO TABS: 650.0000 mg | ORAL_TABLET | ORAL | Status: DC | PRN

## 2015-10-09 MED ORDER — LACTATED RINGERS IV SOLN
INTRAVENOUS | Status: DC
  Administered 2015-10-09 (×2): via INTRAVENOUS

## 2015-10-09 MED ORDER — MEPERIDINE HCL 25 MG/ML IJ SOLN: 6.2500 mg | INTRAMUSCULAR | Status: DC | PRN

## 2015-10-09 SURGICAL SUPPLY — 39 items
APL SKNCLS STERI-STRIP NONHPOA (GAUZE/BANDAGES/DRESSINGS) ×1
BENZOIN TINCTURE PRP APPL 2/3 (GAUZE/BANDAGES/DRESSINGS) ×3 IMPLANT
CLAMP CORD UMBIL (MISCELLANEOUS) IMPLANT
CLOSURE WOUND 1/2 X4 (GAUZE/BANDAGES/DRESSINGS) ×1
CLOTH BEACON ORANGE TIMEOUT ST (SAFETY) ×3 IMPLANT
CONTAINER PREFILL 10% NBF 15ML (MISCELLANEOUS) IMPLANT
DRAPE SHEET LG 3/4 BI-LAMINATE (DRAPES) IMPLANT
DRSG OPSITE POSTOP 4X10 (GAUZE/BANDAGES/DRESSINGS) ×3 IMPLANT
DURAPREP 26ML APPLICATOR (WOUND CARE) ×3 IMPLANT
ELECT REM PT RETURN 9FT ADLT (ELECTROSURGICAL) ×3
ELECTRODE REM PT RTRN 9FT ADLT (ELECTROSURGICAL) ×1 IMPLANT
EXTRACTOR VACUUM M CUP 4 TUBE (SUCTIONS) ×1 IMPLANT
EXTRACTOR VACUUM M CUP 4' TUBE (SUCTIONS) ×1
GLOVE BIO SURGEON STRL SZ 6.5 (GLOVE) ×2 IMPLANT
GLOVE BIO SURGEONS STRL SZ 6.5 (GLOVE) ×1
GOWN STRL REUS W/TWL LRG LVL3 (GOWN DISPOSABLE) ×6 IMPLANT
KIT ABG SYR 3ML LUER SLIP (SYRINGE) IMPLANT
NDL HYPO 25X5/8 SAFETYGLIDE (NEEDLE) IMPLANT
NEEDLE HYPO 25X5/8 SAFETYGLIDE (NEEDLE) IMPLANT
NS IRRIG 1000ML POUR BTL (IV SOLUTION) ×3 IMPLANT
PACK C SECTION WH (CUSTOM PROCEDURE TRAY) ×3 IMPLANT
PAD OB MATERNITY 4.3X12.25 (PERSONAL CARE ITEMS) ×3 IMPLANT
PENCIL SMOKE EVAC W/HOLSTER (ELECTROSURGICAL) ×1 IMPLANT
RTRCTR C-SECT PINK 25CM LRG (MISCELLANEOUS) ×3 IMPLANT
SPONGE GAUZE 4X4 12PLY STER LF (GAUZE/BANDAGES/DRESSINGS) ×4 IMPLANT
STRIP CLOSURE SKIN 1/2X4 (GAUZE/BANDAGES/DRESSINGS) ×2 IMPLANT
SUT MNCRL 0 VIOLET CTX 36 (SUTURE) ×2 IMPLANT
SUT MONOCRYL 0 CTX 36 (SUTURE) ×4
SUT PLAIN 1 NONE 54 (SUTURE) IMPLANT
SUT PLAIN 2 0 XLH (SUTURE) ×5 IMPLANT
SUT VIC AB 0 CT1 27 (SUTURE) ×6
SUT VIC AB 0 CT1 27XBRD ANBCTR (SUTURE) ×2 IMPLANT
SUT VIC AB 2-0 CT1 27 (SUTURE) ×3
SUT VIC AB 2-0 CT1 TAPERPNT 27 (SUTURE) ×1 IMPLANT
SUT VIC AB 4-0 KS 27 (SUTURE) ×3 IMPLANT
SYR BULB IRRIGATION 50ML (SYRINGE) ×3 IMPLANT
TAPE CLOTH SURG 4X10 WHT LF (GAUZE/BANDAGES/DRESSINGS) ×2 IMPLANT
TOWEL OR 17X24 6PK STRL BLUE (TOWEL DISPOSABLE) ×3 IMPLANT
TRAY FOLEY CATH SILVER 14FR (SET/KITS/TRAYS/PACK) IMPLANT

## 2015-10-09 NOTE — Brief Op Note (Signed)
10/09/2015  8:47 AM  PATIENT:  Anne NgVanessa E Castellanos  39 y.o. female  PRE-OPERATIVE DIAGNOSIS:  Repeat C/Section;DESIRE STERILIZATION  POST-OPERATIVE DIAGNOSIS:  Repeat C/Section;DESIRE STERILIZATION  PROCEDURE:  Procedure(s): CESAREAN SECTION WITH BILATERAL TUBAL LIGATION (Bilateral)  SURGEON:  Surgeon(s) and Role:    * Safire Gordin Bovard-Stuckert, MD - Primary  FINDINGS: viable, female infant at 7:50 apgars P, wt P; nl uterus, tubes and ovaries  ANESTHESIA:   spinal  EBL:  Total I/O In: 2600 [I.V.:2600] Out: 800 [Urine:200; Blood:600]  BLOOD ADMINISTERED:none  DRAINS: Urinary Catheter (Foley)   LOCAL MEDICATIONS USED:  NONE  SPECIMEN:  Source of Specimen:  placenta and B tubal segments  DISPOSITION OF SPECIMEN: L&D and PATHOLOGY  COUNTS:  YES  TOURNIQUET:  * No tourniquets in log *  DICTATION: .Other Dictation: Dictation Number [id number]  PLAN OF CARE: Admit to inpatient   PATIENT DISPOSITION:  PACU - hemodynamically stable.   Delay start of Pharmacological VTE agent (>24hrs) due to surgical blood loss or risk of bleeding: not applicable

## 2015-10-09 NOTE — Anesthesia Postprocedure Evaluation (Signed)
  Anesthesia Post-op Note  Patient: [name]  Procedure(s) Performed: Procedure(s): CESAREAN SECTION WITH BILATERAL TUBAL LIGATION (Bilateral)  Patient Location: PACU  Anesthesia Type:Spinal  Level of Consciousness: awake and alert   Airway and Oxygen Therapy: Patient Spontanous Breathing  Post-op Pain: mild  Post-op Assessment: Post-op Vital signs reviewed, Patient's Cardiovascular Status Stable and Respiratory Function Stable              Post-op Vital Signs: Reviewed and stable  Last Vitals:  Filed Vitals:   10/09/15 0920  BP: 153/86  Pulse: 63  Temp:   Resp: 20    Complications: No apparent anesthesia complications

## 2015-10-09 NOTE — Anesthesia Postprocedure Evaluation (Signed)
Anesthesia Post Note  Patient: [name]  Procedure(s) Performed: Procedure(s) (LRB): CESAREAN SECTION WITH BILATERAL TUBAL LIGATION (Bilateral)  Anesthesia type Spinal  Patient location: Mother/Baby  Post pain: Pain level controlled  Post assessment: Post-op Vital signs reviewed  Last Vitals:  Filed Vitals:   10/09/15 1300  BP: 156/82  Pulse: 56  Temp: 37.1 C  Resp: 18    Post vital signs: Reviewed  Level of consciousness:alert  Complications: No apparent anesthesia complications

## 2015-10-09 NOTE — Anesthesia Procedure Notes (Signed)
Spinal Patient location during procedure: OR Start time: 10/09/2015 7:25 AM End time: 10/09/2015 7:33 AM Staffing Anesthesiologist: Sebastian AcheMANNY, Jacolyn Joaquin Preanesthetic Checklist Completed: patient identified, site marked, surgical consent, pre-op evaluation, timeout performed, IV checked, risks and benefits discussed and monitors and equipment checked Spinal Block Patient position: sitting Prep: site prepped and draped and DuraPrep Patient monitoring: heart rate, continuous pulse ox and blood pressure Approach: midline Location: L3-4 Injection technique: single-shot Needle Needle type: Pencil-Tip  Needle gauge: 24 G Needle length: 10 cm Needle insertion depth: 6 cm Additional Notes No complications, no paresthesias

## 2015-10-09 NOTE — Interval H&P Note (Signed)
History and Physical Interval Note:  10/09/2015 7:05 AM  [name]  has presented today for surgery, with the diagnosis of Repeat C/Section;DESIRE STERILIZATION  The various methods of treatment have been discussed with the patient and family. After consideration of risks, benefits and other options for treatment, the patient has consented to  Procedure(s): CESAREAN SECTION WITH BILATERAL TUBAL LIGATION (Bilateral) as a surgical intervention .  The patient's history has been reviewed, patient examined, no change in status, stable for surgery.  I have reviewed the patient's chart and labs.  Questions were answered to the patient's satisfaction.     Bovard-Stuckert, Delshon Blanchfield

## 2015-10-09 NOTE — Transfer of Care (Signed)
Immediate Anesthesia Transfer of Care Note  Patient: [name]  Procedure(s) Performed: Procedure(s): CESAREAN SECTION WITH BILATERAL TUBAL LIGATION (Bilateral)  Patient Location: PACU  Anesthesia Type:Spinal  Level of Consciousness: awake, alert  and oriented  Airway & Oxygen Therapy: Patient Spontanous Breathing  Post-op Assessment: Report given to RN and Post -op Vital signs reviewed and stable  Post vital signs: Reviewed and stable  Last Vitals:  Filed Vitals:   10/09/15 0610  BP: 156/93  Pulse: 83  Temp: 37 C  Resp: 18    Complications: No apparent anesthesia complications

## 2015-10-09 NOTE — Lactation Note (Signed)
This note was copied from the chart of Jaclyn Arlie SolomonsVanessa Savas. Lactation Consultation Note  Patient Name: [name] WUJWJ'XToday's Date: 10/09/2015 Reason for consult: Initial assessment Baby born at 37+0 and very sleepy, she has only fed 1 good time since birth. Baby will latch, suck about 3-4 times, then fall asleep. Mom can manually express, demonstrated how to spoon feed. Baby was able to take ~622ml of expressed breast milk. Went over late preterm baby behavior/green sheet. Given a Harmony for right now, but explained if baby continues to be sleepy she will need to use a DEBP and possible supplement. Went over belly size, milk transition, breast care, and nipple care. She is aware of O/P lactation and support group. She will page for bf as needed.     Maternal Data Has patient been taught Hand Expression?: Yes Does the patient have breastfeeding experience prior to this delivery?: No  Feeding Feeding Type: Breast Fed Length of feed: 5 min  LATCH Score/Interventions Latch: Too sleepy or reluctant, no latch achieved, no sucking elicited. Intervention(s): Skin to skin;Waking techniques Intervention(s): Adjust position;Assist with latch  Audible Swallowing: None Intervention(s): Hand expression Intervention(s): Alternate breast massage  Type of Nipple: Everted at rest and after stimulation  Comfort (Breast/Nipple): Soft / non-tender     Hold (Positioning): Assistance needed to correctly position infant at breast and maintain latch. Intervention(s): Support Pillows  LATCH Score: 5  Lactation Tools Discussed/Used Tools: Other (comment) (manual expression and spoon ) WIC Program: No Pump Review: Setup, frequency, and cleaning;Milk Storage Initiated by:: ES Date initiated:: 10/09/15   Consult Status Consult Status: Follow-up Date: 10/10/15 Follow-up type: In-patient    [name] 10/09/2015, 5:11 PM

## 2015-10-09 NOTE — Progress Notes (Signed)
Dr. Senaida Oresichardson called re pt last 2 blood pressures and difference in them after the labetalol dose. Dr. Senaida Oresichardson gave no further orders at this time pt to continue labetalol. Pt had taken 1/2 of her am dose prior to surgery.

## 2015-10-09 NOTE — Addendum Note (Signed)
Addendum  created 10/09/15 1425 by Lincoln BrighamAngela Draughon Marylin Lathon, CRNA   Modules edited: Notes Section   Notes Section:  File: [id number]

## 2015-10-10 LAB — CBC
HEMATOCRIT: 30.3 % — AB (ref 36.0–46.0)
Hemoglobin: 9.8 g/dL — ABNORMAL LOW (ref 12.0–15.0)
MCH: 26.8 pg (ref 26.0–34.0)
MCHC: 32.3 g/dL (ref 30.0–36.0)
MCV: 82.8 fL (ref 78.0–100.0)
Platelets: 204 10*3/uL (ref 150–400)
RBC: 3.66 MIL/uL — AB (ref 3.87–5.11)
RDW: 15 % (ref 11.5–15.5)
WBC: 14.8 10*3/uL — AB (ref 4.0–10.5)

## 2015-10-10 NOTE — Progress Notes (Signed)
Subjective: Postpartum Day 1: Cesarean Delivery Patient reports tolerating PO and no problems voiding.    Objective: Vital signs in last 24 hours: Temp:  [97.9 F (36.6 C)-99.1 F (37.3 C)] 98.5 F (36.9 C) (10/15 0506) Pulse Rate:  [53-74] 71 (10/15 0506) Resp:  [15-28] 20 (10/15 0506) BP: (121-159)/(68-113) 142/76 mmHg (10/15 0506) SpO2:  [95 %-100 %] 97 % (10/15 0506)  Physical Exam:  General: alert and cooperative Lochia: appropriate Uterine Fundus: firm Incision: C/D/I    Recent Labs  10/08/15 0825 10/10/15 0546  HGB 9.8* 9.8*  HCT 30.2* 30.3*    Assessment/Plan: Status post Cesarean section. Doing well postoperatively.  Continue current care.  [name],[name] 10/10/2015, 8:32 AM

## 2015-10-10 NOTE — Progress Notes (Signed)
Acknowledged order for social work consult for history of anxiety   Met briefly with MOB, and informed her of reason for consult.  She immediately said "I'm fine".     She denies any current symptoms of depression or anxiety.  She reports no hx of PP Depression and stated there was no need for the consult.   Mother reports having an excellent support system.  Provided information on PP Depression and she was receptive.    CSW did not complete full assessment since MOB stated that it was not needed.  Contact CSW if needs arise or upon MOB request.

## 2015-10-10 NOTE — Op Note (Signed)
NAMEDAI, APEL              ACCOUNT NO.:  [id number]  MEDICAL RECORD NO.:  [id number]  LOCATION:  9117                          FACILITY:  [facility]  PHYSICIAN:  Sherron Monday, MD        DATE OF BIRTH:  [birth year]  DATE OF PROCEDURE:  10/09/2015 DATE OF DISCHARGE:                              OPERATIVE REPORT   PREOPERATIVE DIAGNOSIS:  Intrauterine pregnancy at 37 weeks.  Repeat cesarean section due to elevated pressures, undesired fertility.  POSTOPERATIVE DIAGNOSIS:  Intrauterine pregnancy at 37 weeks.  Repeat cesarean section due to elevated pressures, undesired fertility, delivered.  PROCEDURES:  Repeat low-transverse cesarean section with bilateral tubal ligation by Parkland method.  SURGEON:  Sherron Monday, MD  ANESTHESIA:  Spinal.  ASSISTANT:  None.  FINDINGS:  Viable female infant at 7:50 a.m. with Apgars pending at the time of dictation.  Weight pending at the time of dictation.  Normal uterus, tubes, and ovaries are noted.  EBL:  Approximately 600 mL.  IV FLUIDS:  2600 mL.  URINE OUTPUT:  200 mL clear urine at the end of the procedure.  COMPLICATIONS:  None.  PATHOLOGY:  Placenta to L and D, and bilateral tubal segments to Pathology.  DESCRIPTION OF PROCEDURE:  After informed consent was reviewed with the patient including risks, benefits, and alternatives of the surgical procedure, she was transported to the OR in stable condition.  Spinal anesthesia was placed and found to be appropriate.  She was then returned to the supine position with a leftward tilt, prepped and draped in the normal sterile fashion.  A Foley catheter was sterilely placed. After an appropriate time-out was performed and the level of anesthesia was verified, a Pfannenstiel skin incision was made at the level of her previous incision, carried through the underlying layer of fascia sharply.  The fascia was incised in the midline and excision was extended laterally with Mayo scissors.  The  superior aspect of the fascial incision was grasped with Kocher clamps, elevated and the rectus muscles were dissected off both bluntly and sharply.  Peritoneum was entered bluntly and entrance was extended superiorly and inferiorly with good visualization of the bladder.  The Alexis skin retractor was placed, carefully making sure no bowel was entrapped.  The uterus was incised in transverse fashion.  Infant was delivered with the aid of a vacuum from a vertex presentation.  Nose and mouth were suctioned on the field.  Cord was clamped and cut.  The infant was handed off to the waiting pediatric staff.  The cord clamping was delayed for a minute. After the infant was handed off, placenta was expressed from the uterus. Uterus was cleared of all clot and debris.  Uterine incision was closed with 0 Monocryl, first of which as a running locked suture, second as an imbricating layer.  An additional suture of 0 Vicryl was placed in an area that was bleeding.  The tubes were identified followed out to the fimbriated end.  The mesosalpinx was elevated with Babcock.  Window was created and the tube was ligated in a Parkland fashion, doubly ligated with plain gut.  The intervening portion was excised and sent to Pathology.  This  was performed on both the left and right, noted to be hemostatic.  The Alexis retractor was then removed.  The peritoneum was reapproximated with 2-0 Vicryl in a running fashion.  The subfascial planes were inspected, found to be hemostatic.  The fascia was reapproximated with 0 Vicryl in a running fashion.  Subcuticular adipose layer was made hemostatic with Bovie cautery.  The previous incision had been noted to be hypertrophic and this was excised per the patient's request.  The skin was then undermined, the skin was closed with a Mellody DanceKeith needle in a subcu fashion.  Benzoin and Steri-Strips were applied as well as the pressure dressing.  The patient tolerated the  procedure well.  Sponge, lap, and needle counts were correct x2 per the operating staff.     Sherron MondayJody Bovard, MD     JB/MEDQ  D:  10/09/2015  T:  10/10/2015  Job:  [id number]

## 2015-10-11 MED ORDER — IBUPROFEN 800 MG PO TABS: 800.0000 mg | ORAL_TABLET | Freq: Three times a day (TID) | ORAL | Status: AC

## 2015-10-11 MED ORDER — LABETALOL HCL 200 MG PO TABS: 100.0000 mg | ORAL_TABLET | Freq: Two times a day (BID) | ORAL | Status: DC

## 2015-10-11 MED ORDER — OXYCODONE-ACETAMINOPHEN 5-325 MG PO TABS: 1.0000 | ORAL_TABLET | ORAL | Status: AC | PRN

## 2015-10-11 MED ORDER — AMLODIPINE BESYLATE 5 MG PO TABS
5.0000 mg | ORAL_TABLET | Freq: Every day | ORAL | Status: DC
  Administered 2015-10-11: 5 mg via ORAL
  Filled 2015-10-11: qty 1

## 2015-10-11 MED ORDER — AMLODIPINE BESYLATE 5 MG PO TABS: 5.0000 mg | ORAL_TABLET | Freq: Every day | ORAL | Status: AC

## 2015-10-11 NOTE — Lactation Note (Addendum)
This note was copied from the chart of [name]. Lactation Consultation Note  Patient Name: [name] WUJWJ'XToday's Date: 10/11/2015 Reason for consult: Follow-up assessment   Follow-up consult at 341 hours old at RNs request.  GA 37.0; BW 6 lbs, 14.6 oz.  8% weight loss at tonight's weight check.   Infant is not latching to breast and has been spoon feeding colostrum mom has HE &/or pumped with DEBP. Infant has breastfed x2 (30 min on/off) + attempts x5 (0 min-few sucks) + EBM x2 via spoon (3-10 ml) in past 24 hours; voids-6; stools-3; LS-6 by RN.  Yesterday's feedings were also all attempts. When LC entered room, infant in crib quietly awake.  Mom had fed 10 ml one hour prior to entering.  LC offered help with latching to assess latch; mom consented. As LC began unwrapping infant, infant began crying.   Bowling of tongue noted with crying.  Oral assessment revealed thick wide upper lip frenulum that extends to tip of gum line.  Posterior frenulum noted to be short, blanches with lifting of tongue.  When sucking on gloved finger infant does not maintain contact with finger but noted a slapping motion with tongue.  Parents not interested in seeking further professional assessment for tongue restriction.   Mom attempted latching on left side using cradle hold and allowing infant to self latch.  LC taught mom cross-cradle hold with sandwiching breast and asymmetrical latching technique.  Mom has large breast with large nipples, but infant was able to get all of nipple in her mouth.  Mom immediately let go of breast after infant latched causing nipple to fall out of infant's mouth (which could help explain some of the on/off behavior at breast prior to present feeding).  LC encouraged mom to continue to support breast.  Infant fed for 5 minutes and then went to sleep. LC went over [employer] Green Sheet with parents and explained need to begin following protocol for LPTI for their baby.  Explained  risk and the need to meet baby's nutritional needs.   Mom consented to giving the baby formula (as needed) after giving baby EBM she pumps.  Mom stated she did not plan to breastfeed for long but wanted to "try" it.   Mom plans to continue pumping to given EBM and then follow after with formula to meet nutritional needs.  RN to take mom regular formula for supplementation based on guidelines since parents plan to continue supplementing after discharge.   Explained the need to pump 8 times per day with hands-on pumping and hand expression at end of pumping session to establish and maintain good milk supply.  Gave curved-tip syringe and foley cup for feeding larger amounts of EBM and taught how to use.   Parents also expressed desire to give bottle.  RN to take bottle with slow-flow nipple to parents.  Explained paced-feeding method to parents and rationale for paced-feedings. Encouraged parents to call for assistance as needed and to follow-up with RN with supplementing.   LC called RN about consult.    Maternal Data    Feeding Feeding Type: Breast Fed Length of feed: 5 min  LATCH Score/Interventions Latch: Repeated attempts needed to sustain latch, nipple held in mouth throughout feeding, stimulation needed to elicit sucking reflex.  Audible Swallowing: None Intervention(s): Hand expression  Type of Nipple: Everted at rest and after stimulation  Comfort (Breast/Nipple): Soft / non-tender     Hold (Positioning): Assistance needed to correctly position infant  at breast and maintain latch. Intervention(s): Breastfeeding basics reviewed;Support Pillows;Skin to skin  LATCH Score: 6  Lactation Tools Discussed/Used     Consult Status Consult Status: Follow-up Date: 10/11/15 Follow-up type: In-patient    Lendon Ka 10/11/2015, 12:55 AM

## 2015-10-11 NOTE — Progress Notes (Signed)
Subjective: Postpartum Day 2: Cesarean Delivery Patient reports tolerating PO and no problems voiding. No HA or PIH sx, feels good.  Requesting d/c to home   Objective: Vital signs in last 24 hours: Temp:  [98.1 F (36.7 C)-99.5 F (37.5 C)] 98.1 F (36.7 C) (10/16 0558) Pulse Rate:  [70-73] 70 (10/16 0927) Resp:  [18-20] 18 (10/16 0558) BP: (152-170)/(79-91) 152/91 mmHg (10/16 0927) SpO2:  [97 %] 97 % (10/15 1800)  Physical Exam:  General: alert and cooperative Lochia: appropriate Uterine Fundus: firm Incision: C/D/I   Recent Labs  10/10/15 0546  HGB 9.8*  HCT 30.3*    Assessment/Plan: Status post Cesarean section. Pt doing well. But BP still abit elevated.  She state she was on norvasc prior to pregnancy with good control.   She does not plan to breastfeed.  We will restart her norvasc 5mg  po qd and decrease labetalol to 100mg  po BID She is agreeable to f/u of BP in office in 2-3 days if goes home today  Reviewed with her incision care  Lindwood Mogel W 10/11/2015, 10:19 AM

## 2015-10-11 NOTE — Discharge Summary (Signed)
Obstetric Discharge Summary Reason for Admission: cesarean section Prenatal Procedures: Hypertension with worsening BP Intrapartum Procedures: cesarean: low cervical, transverse and tubal ligation Postpartum Procedures: BP management Complications-Operative and Postpartum: elevated BP HEMOGLOBIN  Date Value Ref Range Status  10/10/2015 9.8* 12.0 - 15.0 g/dL Final   HGB  Date Value Ref Range Status  09/16/2009 12.5 11.6 - 15.9 g/dL Final   HCT  Date Value Ref Range Status  10/10/2015 30.3* 36.0 - 46.0 % Final  09/16/2009 38.2 34.8 - 46.6 % Final    Physical Exam:  General: alert and cooperative Lochia: appropriate Uterine Fundus: firm Incision: C/D/I   Discharge Diagnoses: Term Pregnancy-delivered                                         CHTN with worsening BP Discharge Information: Date: 10/11/2015 Activity: pelvic rest Diet: routine Medications: Ibuprofen, Percocet and norvasc (5mg  po q day and labetalol 100mg  po BID) Condition: improved Instructions: refer to practice specific booklet Discharge to: home Follow-up Information    Follow up with Bovard-Stuckert, Augusto GambleJody, MD In 2 days.   Specialty:  Obstetrics and Gynecology   Why:  BP check--please let office know when you will be coming    Contact information:   [address] [place] [address] [phone]       Newborn Data: Live born female  Birth Weight: 6 lb 14.6 oz (3135 g) APGAR: 9, 9  Home with mother.  Oliver PilaICHARDSON,Maeola Mchaney W 10/11/2015, 10:29 AM

## 2015-10-12 ENCOUNTER — Encounter (HOSPITAL_COMMUNITY): Payer: Self-pay | Admitting: Obstetrics and Gynecology

## 2015-10-22 ENCOUNTER — Encounter (HOSPITAL_COMMUNITY): Payer: Self-pay | Admitting: *Deleted

## 2015-10-22 ENCOUNTER — Inpatient Hospital Stay (HOSPITAL_COMMUNITY)
Admission: AD | Admit: 2015-10-22 | Discharge: 2015-10-23 | Disposition: A | Discharge status: Home / Self Care | Payer: PRIVATE HEALTH INSURANCE | Source: Ambulatory Visit | Attending: Obstetrics and Gynecology | Admitting: Obstetrics and Gynecology

## 2015-10-22 DIAGNOSIS — Z87891 Personal history of nicotine dependence: Secondary | ICD-10-CM | POA: Diagnosis not present

## 2015-10-22 DIAGNOSIS — I10 Essential (primary) hypertension: Secondary | ICD-10-CM | POA: Diagnosis not present

## 2015-10-22 DIAGNOSIS — O135 Gestational [pregnancy-induced] hypertension without significant proteinuria, complicating the puerperium: Secondary | ICD-10-CM | POA: Insufficient documentation

## 2015-10-22 DIAGNOSIS — O10919 Unspecified pre-existing hypertension complicating pregnancy, unspecified trimester: Secondary | ICD-10-CM

## 2015-10-22 LAB — URINALYSIS, ROUTINE W REFLEX MICROSCOPIC
Bilirubin Urine: NEGATIVE
GLUCOSE, UA: NEGATIVE mg/dL
Ketones, ur: NEGATIVE mg/dL
LEUKOCYTES UA: NEGATIVE
NITRITE: NEGATIVE
PH: 5.5 (ref 5.0–8.0)
Protein, ur: NEGATIVE mg/dL
SPECIFIC GRAVITY, URINE: 1.02 (ref 1.005–1.030)
Urobilinogen, UA: 0.2 mg/dL (ref 0.0–1.0)

## 2015-10-22 LAB — COMPREHENSIVE METABOLIC PANEL
ALBUMIN: 3.9 g/dL (ref 3.5–5.0)
ALK PHOS: 89 U/L (ref 38–126)
ALT: 18 U/L (ref 14–54)
AST: 16 U/L (ref 15–41)
Anion gap: 6 (ref 5–15)
BUN: 19 mg/dL (ref 6–20)
CHLORIDE: 112 mmol/L — AB (ref 101–111)
CO2: 23 mmol/L (ref 22–32)
Calcium: 9.1 mg/dL (ref 8.9–10.3)
Creatinine, Ser: 0.78 mg/dL (ref 0.44–1.00)
Glucose, Bld: 106 mg/dL — ABNORMAL HIGH (ref 65–99)
POTASSIUM: 3.2 mmol/L — AB (ref 3.5–5.1)
SODIUM: 141 mmol/L (ref 135–145)
Total Bilirubin: 0.4 mg/dL (ref 0.3–1.2)
Total Protein: 7.4 g/dL (ref 6.5–8.1)

## 2015-10-22 LAB — PROTEIN / CREATININE RATIO, URINE
Creatinine, Urine: 81 mg/dL
PROTEIN CREATININE RATIO: 0.12 mg/mg{creat} (ref 0.00–0.15)
TOTAL PROTEIN, URINE: 10 mg/dL

## 2015-10-22 LAB — URINE MICROSCOPIC-ADD ON

## 2015-10-22 LAB — CBC
HEMATOCRIT: 36.6 % (ref 36.0–46.0)
HEMOGLOBIN: 12.1 g/dL (ref 12.0–15.0)
MCH: 26.7 pg (ref 26.0–34.0)
MCHC: 33.1 g/dL (ref 30.0–36.0)
MCV: 80.8 fL (ref 78.0–100.0)
Platelets: 309 10*3/uL (ref 150–400)
RBC: 4.53 MIL/uL (ref 3.87–5.11)
RDW: 14.2 % (ref 11.5–15.5)
WBC: 14.4 10*3/uL — AB (ref 4.0–10.5)

## 2015-10-22 MED ORDER — LABETALOL HCL 100 MG PO TABS
200.0000 mg | ORAL_TABLET | Freq: Once | ORAL | Status: AC
  Administered 2015-10-22: 200 mg via ORAL
  Filled 2015-10-22: qty 2

## 2015-10-22 NOTE — MAU Provider Note (Signed)
History     CSN: [id number]  Arrival date and time: 10/22/15 2049   First Provider Initiated Contact with Patient 10/22/15 2149      Chief Complaint  Patient presents with  . Hypertension   HPI Comments: [name] is a 39 y.o. [id number] who is S/P c-section on 10/09/15. Currently on labetalol  BID, amlodipine  QD. She states that prior to pregnancy she was on amlodipine and HCTZ. Since Tuesday at her FU appointment. She has a FU appointment tomorrow in the office. Primary is [name]. She reports headaceh since Tuesday. Denies visual disturbances. Denies RUQ pain. Patient is not breastfeeding.    Hypertension This is a new problem. The current episode started more than 1 month ago. The problem has been waxing and waning since onset. The problem is uncontrolled. Pertinent negatives include no blurred vision or headaches. Past treatments include beta blockers and diuretics. The current treatment provides mild improvement. There are no compliance problems.     Past Medical History  Diagnosis Date  . Hypertension   . GERD (gastroesophageal reflux disease)     pregnacy related    Past Surgical History  Procedure Laterality Date  . Cesarean section      x1  . Cesarean section with bilateral tubal ligation Bilateral 10/09/2015    Procedure: CESAREAN SECTION WITH BILATERAL TUBAL LIGATION;  Surgeon: Sherian Rein, MD;  Location: [facility];  Service: Obstetrics;  Laterality: Bilateral;    History reviewed. No pertinent family history.  Social History  Substance Use Topics  . Smoking status: Former Smoker -- 0.50 packs/day    Quit date: 01/07/2014  . Smokeless tobacco: None  . Alcohol Use: No    Allergies:  Allergies  Allergen Reactions  . Shellfish Allergy Itching    Throat itching, gi upset    Prescriptions prior to admission  Medication Sig Dispense Refill Last Dose  . aspirin-acetaminophen-caffeine (EXCEDRIN MIGRAINE) 250-250-65 MG tablet Take 2  tablets by mouth every 6 (six) hours as needed for headache.   10/22/2015 at Unknown time  . calcium carbonate (TUMS - DOSED IN MG ELEMENTAL CALCIUM) 500 MG chewable tablet Chew 1 tablet by mouth daily as needed for indigestion or heartburn.   10/21/2015 at Unknown time  . ibuprofen (ADVIL,MOTRIN) 800 MG tablet Take 1 tablet (800 mg total) by mouth every 8 (eight) hours. 30 tablet 0 Past Week at Unknown time  . labetalol (NORMODYNE) 200 MG tablet Take 0.5 tablets (100 mg total) by mouth 2 (two) times daily. 30 tablet 1 10/22/2015 at 1100  . Prenatal Vit-Fe Fumarate-FA (PRENATAL MULTIVITAMIN) TABS tablet Take 1 tablet by mouth daily at 12 noon.   10/22/2015 at Unknown time  . amLODipine (NORVASC) 5 MG tablet Take 1 tablet (5 mg total) by mouth daily. (Patient taking differently: Take 10 mg by mouth daily. ) 30 tablet 3 Not Taking at Unknown time  . oxyCODONE-acetaminophen (PERCOCET/ROXICET) 5-325 MG tablet Take 1 tablet by mouth every 4 (four) hours as needed (for pain scale 4-7). (Patient not taking: Reported on 10/22/2015) 30 tablet 0 Not Taking at Unknown time    Review of Systems  Constitutional: Negative for fever.  Eyes: Negative for blurred vision.  Gastrointestinal: Negative for nausea, vomiting, abdominal pain, diarrhea and constipation.  Genitourinary: Negative for dysuria, urgency and frequency.  Neurological: Negative for dizziness and headaches.   Physical Exam   Blood pressure 152/97, pulse 62, temperature 98.1 F (36.7 C), resp. rate 18, last menstrual period 12/26/2013, unknown if currently  breastfeeding.  Physical Exam  Nursing note and vitals reviewed. Constitutional: She is oriented to person, place, and time. She appears well-developed and well-nourished. No distress.  HENT:  Head: Normocephalic.  Cardiovascular: Normal rate.   Respiratory: Effort normal.  GI: Soft. There is no tenderness. There is no rebound.  Neurological: She is alert and oriented to person, place,  and time.  Skin: Skin is warm and dry.  Psychiatric: She has a normal mood and affect.   Results for orders placed or performed during the hospital encounter of 10/22/15 (from the past 24 hour(s))  Urinalysis, Routine w reflex microscopic ([facility])     Status: Abnormal   Collection Time: 10/22/15  9:10 PM  Result Value Ref Range   Color, Urine YELLOW YELLOW   APPearance CLEAR CLEAR   Specific Gravity, Urine 1.020 1.005 - 1.030   pH 5.5 5.0 - 8.0   Glucose, UA NEGATIVE NEGATIVE mg/dL   Hgb urine dipstick LARGE (A) NEGATIVE   Bilirubin Urine NEGATIVE NEGATIVE   Ketones, ur NEGATIVE NEGATIVE mg/dL   Protein, ur NEGATIVE NEGATIVE mg/dL   Urobilinogen, UA 0.2 0.0 - 1.0 mg/dL   Nitrite NEGATIVE NEGATIVE   Leukocytes, UA NEGATIVE NEGATIVE  Protein / creatinine ratio, urine     Status: None   Collection Time: 10/22/15  9:10 PM  Result Value Ref Range   Creatinine, Urine 81.00 mg/dL   Total Protein, Urine 10 mg/dL   Protein Creatinine Ratio 0.12 0.00 - 0.15 mg/mg[Cre]  Urine microscopic-add on     Status: Abnormal   Collection Time: 10/22/15  9:10 PM  Result Value Ref Range   Squamous Epithelial / LPF FEW (A) RARE   WBC, UA 0-2 <3 WBC/hpf   RBC / HPF 0-2 <3 RBC/hpf   Bacteria, UA FEW (A) RARE   Urine-Other MUCOUS PRESENT   CBC     Status: Abnormal   Collection Time: 10/22/15  9:45 PM  Result Value Ref Range   WBC 14.4 (H) 4.0 - 10.5 K/uL   RBC 4.53 3.87 - 5.11 MIL/uL   Hemoglobin 12.1 12.0 - 15.0 g/dL   HCT 16.136.6 09.636.0 - 04.546.0 %   MCV 80.8 78.0 - 100.0 fL   MCH 26.7 26.0 - 34.0 pg   MCHC 33.1 30.0 - 36.0 g/dL   RDW 40.914.2 81.111.5 - 91.415.5 %   Platelets 309 150 - 400 K/uL  Comprehensive metabolic panel     Status: Abnormal   Collection Time: 10/22/15  9:45 PM  Result Value Ref Range   Sodium 141 135 - 145 mmol/L   Potassium 3.2 (L) 3.5 - 5.1 mmol/L   Chloride 112 (H) 101 - 111 mmol/L   CO2 23 22 - 32 mmol/L   Glucose, Bld 106 (H) 65 - 99 mg/dL   BUN 19 6 - 20 mg/dL    Creatinine, Ser 7.820.78 0.44 - 1.00 mg/dL   Calcium 9.1 8.9 - 95.610.3 mg/dL   Total Protein 7.4 6.5 - 8.1 g/dL   Albumin 3.9 3.5 - 5.0 g/dL   AST 16 15 - 41 U/L   ALT 18 14 - 54 U/L   Alkaline Phosphatase 89 38 - 126 U/L   Total Bilirubin 0.4 0.3 - 1.2 mg/dL   GFR calc non Af Amer >60 >60 mL/min   GFR calc Af Amer >60 >60 mL/min   Anion gap 6 5 - 15    MAU Course  Procedures  MDM 2255: D/W Dr. Ambrose MantleHenley, will give labetalol now. Urine  pending. Will call back with urine results.  2326: Labetalol given  0015: OK for DC home. Will increase labetalol to  BID, continue amlodipine. FU in the office tomorrow.   Assessment and Plan   1. Chronic hypertension complicating or reason for care during pregnancy, unspecified trimester    DC home Comfort measures reviewed  Pre-eclampsia warning signs reviewed  RX: increase labetalol to  BID  Return to MAU as needed   Follow-up Information    Follow up with Bing Plume, MD In 1 day.   Specialty:  Obstetrics and Gynecology   Why:  Follow up tomorrow as planned    Contact information:   [address], [address] [place] [address] [phone]         Tawnya Crook 10/23/2015, 12:01 AM

## 2015-10-22 NOTE — MAU Note (Signed)
Urine sent to lab 

## 2015-10-22 NOTE — MAU Note (Addendum)
Pt has chronic hypertension prior to pregnancy, was controlled with medication. Did have c/section due to elevated b/ps at the end of her pregnancy.  Started having a headache and dizziness at home took her pressure at it was 166/113 172/110./ went to her Doctor /urgent care and it was 180/110. Sent her for further eval. Denies andy abd pain  C/o mild headache that was worse earlier today.

## 2015-10-23 DIAGNOSIS — I10 Essential (primary) hypertension: Secondary | ICD-10-CM | POA: Diagnosis not present

## 2015-10-23 DIAGNOSIS — O10919 Unspecified pre-existing hypertension complicating pregnancy, unspecified trimester: Secondary | ICD-10-CM

## 2015-10-23 MED ORDER — LABETALOL HCL 200 MG PO TABS: 200.0000 mg | ORAL_TABLET | Freq: Two times a day (BID) | ORAL | Status: AC

## 2015-10-23 NOTE — Discharge Instructions (Signed)

## 2020-02-28 ENCOUNTER — Ambulatory Visit: Payer: PRIVATE HEALTH INSURANCE | Attending: Internal Medicine

## 2020-02-28 DIAGNOSIS — Z23 Encounter for immunization: Secondary | ICD-10-CM | POA: Insufficient documentation

## 2020-02-28 NOTE — Progress Notes (Signed)
   Cov[facility] Vaccination [facility]  Name:  [name]    MRN: [id number] DOB: [birth year]  02/28/2020  [name] was observed post Covid-19 immunization for 15 minutes without incident. She was provided with Vaccine Information Sheet and instruction to access the V-Safe system.   [name] was instructed to call 911 with any severe reactions post vaccine: Marland Kitchen Difficulty breathing  . Swelling of face and throat  . A fast heartbeat  . A bad rash all over body  . Dizziness and weakness   Immunizations Administered    Name Date Dose VIS Date Route   Pfizer COVID-19 Vaccine 02/28/2020  6:18 PM 0.3 mL 12/06/2019 Intramuscular   Manufacturer: [employer], [employer]   Lot: [id number]   NDC: [address]-3

## 2020-03-31 ENCOUNTER — Ambulatory Visit: Payer: PRIVATE HEALTH INSURANCE

## 2024-04-15 ENCOUNTER — Other Ambulatory Visit: Payer: Self-pay | Admitting: Family Medicine

## 2024-04-15 DIAGNOSIS — Z1231 Encounter for screening mammogram for malignant neoplasm of breast: Secondary | ICD-10-CM

## 2024-05-22 ENCOUNTER — Ambulatory Visit
Admission: RE | Admit: 2024-05-22 | Discharge: 2024-05-22 | Disposition: A | Discharge status: Home / Self Care | Payer: PRIVATE HEALTH INSURANCE | Source: Ambulatory Visit | Attending: Family Medicine | Admitting: Family Medicine

## 2024-05-22 DIAGNOSIS — Z1231 Encounter for screening mammogram for malignant neoplasm of breast: Secondary | ICD-10-CM
